# Patient Record
Sex: Female | Born: 1970 | Race: White | Hispanic: No | Marital: Married | Smoking: Never smoker
Health system: Southern US, Community
[De-identification: ages and names within clinical notes are randomized; demographics above are authoritative.]

## PROBLEM LIST (undated history)

## (undated) DIAGNOSIS — G43829 Menstrual migraine, not intractable, without status migrainosus: Secondary | ICD-10-CM

## (undated) DIAGNOSIS — D649 Anemia, unspecified: Secondary | ICD-10-CM

## (undated) DIAGNOSIS — F419 Anxiety disorder, unspecified: Secondary | ICD-10-CM

## (undated) HISTORY — DX: Menstrual migraine, not intractable, without status migrainosus: G43.829

## (undated) HISTORY — DX: Anemia, unspecified: D64.9

---

## 1998-05-20 ENCOUNTER — Other Ambulatory Visit: Admission: RE | Admit: 1998-05-20 | Discharge: 1998-05-20 | Payer: Self-pay | Admitting: *Deleted

## 1998-06-18 ENCOUNTER — Other Ambulatory Visit: Admission: RE | Admit: 1998-06-18 | Discharge: 1998-06-18 | Payer: Self-pay | Admitting: *Deleted

## 1998-12-04 HISTORY — PX: LAPAROSCOPY: SHX197

## 1999-04-22 ENCOUNTER — Other Ambulatory Visit: Admission: RE | Admit: 1999-04-22 | Discharge: 1999-04-22 | Payer: Self-pay | Admitting: Obstetrics and Gynecology

## 1999-11-13 ENCOUNTER — Inpatient Hospital Stay (HOSPITAL_COMMUNITY): Admission: AD | Admit: 1999-11-13 | Discharge: 1999-11-17 | Payer: Self-pay | Admitting: Obstetrics and Gynecology

## 1999-12-26 ENCOUNTER — Other Ambulatory Visit: Admission: RE | Admit: 1999-12-26 | Discharge: 1999-12-26 | Payer: Self-pay | Admitting: Obstetrics and Gynecology

## 2000-12-17 ENCOUNTER — Other Ambulatory Visit: Admission: RE | Admit: 2000-12-17 | Discharge: 2000-12-17 | Payer: Self-pay | Admitting: Obstetrics and Gynecology

## 2001-10-11 ENCOUNTER — Observation Stay (HOSPITAL_COMMUNITY): Admission: AD | Admit: 2001-10-11 | Discharge: 2001-10-12 | Payer: Self-pay | Admitting: *Deleted

## 2001-10-11 ENCOUNTER — Encounter: Payer: Self-pay | Admitting: *Deleted

## 2001-10-24 ENCOUNTER — Inpatient Hospital Stay (HOSPITAL_COMMUNITY): Admission: AD | Admit: 2001-10-24 | Discharge: 2001-10-27 | Payer: Self-pay | Admitting: Obstetrics and Gynecology

## 2001-11-19 ENCOUNTER — Other Ambulatory Visit: Admission: RE | Admit: 2001-11-19 | Discharge: 2001-11-19 | Payer: Self-pay | Admitting: Obstetrics and Gynecology

## 2002-12-18 ENCOUNTER — Other Ambulatory Visit: Admission: RE | Admit: 2002-12-18 | Discharge: 2002-12-18 | Payer: Self-pay | Admitting: Obstetrics and Gynecology

## 2003-12-23 ENCOUNTER — Other Ambulatory Visit: Admission: RE | Admit: 2003-12-23 | Discharge: 2003-12-23 | Payer: Self-pay | Admitting: Obstetrics and Gynecology

## 2005-07-05 ENCOUNTER — Encounter: Admission: RE | Admit: 2005-07-05 | Discharge: 2005-07-05 | Payer: Self-pay | Admitting: Family Medicine

## 2008-01-13 ENCOUNTER — Encounter: Admission: RE | Admit: 2008-01-13 | Discharge: 2008-01-13 | Payer: Self-pay | Admitting: Family Medicine

## 2008-01-17 ENCOUNTER — Encounter: Admission: RE | Admit: 2008-01-17 | Discharge: 2008-01-17 | Payer: Self-pay | Admitting: Family Medicine

## 2011-04-10 ENCOUNTER — Emergency Department (HOSPITAL_COMMUNITY): Payer: BC Managed Care – PPO

## 2011-04-10 ENCOUNTER — Emergency Department (HOSPITAL_COMMUNITY)
Admission: EM | Admit: 2011-04-10 | Discharge: 2011-04-11 | Disposition: A | Discharge status: Home / Self Care | Payer: BC Managed Care – PPO | Attending: Emergency Medicine | Admitting: Emergency Medicine

## 2011-04-10 DIAGNOSIS — R0609 Other forms of dyspnea: Secondary | ICD-10-CM | POA: Insufficient documentation

## 2011-04-10 DIAGNOSIS — R0989 Other specified symptoms and signs involving the circulatory and respiratory systems: Secondary | ICD-10-CM | POA: Insufficient documentation

## 2011-04-10 DIAGNOSIS — R079 Chest pain, unspecified: Secondary | ICD-10-CM | POA: Insufficient documentation

## 2011-04-10 DIAGNOSIS — R0602 Shortness of breath: Secondary | ICD-10-CM | POA: Insufficient documentation

## 2011-04-10 DIAGNOSIS — Z79899 Other long term (current) drug therapy: Secondary | ICD-10-CM | POA: Insufficient documentation

## 2011-04-10 LAB — CK TOTAL AND CKMB (NOT AT ARMC)
CK, MB: 0.9 ng/mL (ref 0.3–4.0)
CK, MB: 0.9 ng/mL (ref 0.3–4.0)
Relative Index: INVALID (ref 0.0–2.5)
Total CK: 47 U/L (ref 7–177)

## 2011-04-10 LAB — TROPONIN I: Troponin I: <0.3 ng/mL (ref <0.30)

## 2011-04-10 LAB — CBC
HCT: 39.4 % (ref 36.0–46.0)
MCHC: 34.5 g/dL (ref 30.0–36.0)
Platelets: 362 10*3/uL (ref 150–400)
RDW: 12.3 % (ref 11.5–15.5)
WBC: 8.9 10*3/uL (ref 4.0–10.5)

## 2011-04-10 LAB — DIFFERENTIAL
Basophils Absolute: 0 10*3/uL (ref 0.0–0.1)
Eosinophils Absolute: 0.1 10*3/uL (ref 0.0–0.7)
Eosinophils Relative: 1 % (ref 0–5)
Monocytes Absolute: 0.5 10*3/uL (ref 0.1–1.0)

## 2011-04-10 LAB — POCT CARDIAC MARKERS
CKMB, poc: <1 ng/mL — ABNORMAL LOW (ref 1.0–8.0)
CKMB, poc: <1 ng/mL — ABNORMAL LOW (ref 1.0–8.0)
Myoglobin, poc: 54.2 ng/mL (ref 12–200)

## 2011-04-10 LAB — BASIC METABOLIC PANEL
BUN: 9 mg/dL (ref 6–23)
CO2: 26 mEq/L (ref 19–32)
Calcium: 9.7 mg/dL (ref 8.4–10.5)
Glucose, Bld: 89 mg/dL (ref 70–99)
Potassium: 4.1 mEq/L (ref 3.5–5.1)
Sodium: 139 mEq/L (ref 135–145)

## 2011-04-10 LAB — D-DIMER, QUANTITATIVE: D-Dimer, Quant: 0.23 ug/mL-FEU (ref 0.00–0.48)

## 2011-04-10 LAB — POCT PREGNANCY, URINE: Preg Test, Ur: NEGATIVE

## 2011-04-11 DIAGNOSIS — R072 Precordial pain: Secondary | ICD-10-CM

## 2011-04-13 ENCOUNTER — Other Ambulatory Visit: Payer: Self-pay | Admitting: Family Medicine

## 2011-04-13 ENCOUNTER — Ambulatory Visit
Admission: RE | Admit: 2011-04-13 | Discharge: 2011-04-13 | Disposition: A | Discharge status: Home / Self Care | Payer: BC Managed Care – PPO | Source: Ambulatory Visit | Attending: Family Medicine | Admitting: Family Medicine

## 2011-04-13 DIAGNOSIS — R0602 Shortness of breath: Secondary | ICD-10-CM

## 2011-04-13 MED ORDER — IOHEXOL 300 MG/ML  SOLN
125.0000 mL | Freq: Once | INTRAMUSCULAR | Status: AC | PRN
  Administered 2011-04-13: 125 mL via INTRAVENOUS

## 2011-04-21 NOTE — H&P (Signed)
[facility] of [facility]  Patient:    Bridget Hicks, Bridget Hicks Visit Number: [id number] MRN: [id number]          Service Type: OBS Location: 910B 9163 01 Attending Physician:  Ermalene Searing Dictated by:   Lenoard Aden, M.D. Admit Date:  10/11/2001                           History and Physical  CHIEF COMPLAINT:              Acute onset of epigastric pain and nausea and vomiting.  HISTORY OF PRESENT ILLNESS:   The patient is a 40 year old white female, G3, P1, EDD November 02, 2001, at 36 6/7 weeks who presents with aforementioned complaints. No history of pregnancy-induced hypertension. She reports nausea, vomiting. Denies fever, chills. Has had an episode of diarrhea today. Reports people at home, too, have been sick.  PAST MEDICAL HISTORY:         Remarkable for no medical or surgical hospitalizations outside of pregnancy, except for a laparoscopy for endometriosis in 2000. 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  ALLERGIES:                    No known drug allergies.  MEDICATIONS:                  Prenatal vitamins.  PRENATAL COURSE:              Uncomplicated, except for new-onset nausea and vomiting today.  PHYSICAL EXAMINATION:  GENERAL:                      She is a well-developed, well-nourished white female. No apparent distress.  HEENT:                        Normal.  LUNGS:                        Clear.  HEART:                        Regular rate and rhythm.  ABDOMEN:                      Soft, gravid, nontender. Estimated fetal weight of 8 1/2 to 9 pounds. Cervix is 3 cm, 50%, vertex, -2.  EXTREMITIES:                  DTRs 2+. No evidence of clonus.  NEUROLOGICAL:                 Nonfocal.  LABORATORY DATA:              CBC reveals a white blood cell count of 15,000 with left shift and  normal PIH labs. Protein without evidence of protein.  IMPRESSION:                   Acute onset epigastric pain with unclear etiology at this time. Rule out appendicitis. Rule out acute viral gastroenteritis. Rule out labor. NFT is reactive. Irregular contractions noted.  PLAN:  Admit. Follow serial labs. Monitor for cervical change at this time. If patient labors, she wishes repeat c-section. Otherwise, we will watch closely for atypical preeclampsia. Further diagnostic studies to be ordered if pain increases. Dictated by:   Lenoard Aden, M.D. Attending Physician:  Marina Gravel B DD:  10/11/01 TD:  10/11/01 Job: [id number]

## 2011-04-21 NOTE — Op Note (Signed)
[facility]  Patient:    Bridget Hicks                  MRN: [id number] Proc. Date: 11/14/99 Adm. Date:  [id number] Attending:  Lenoard Aden CC:         Lenoard Aden, M.D.                           Operative Report  PREOPERATIVE DIAGNOSIS:       41 week intrauterine pregnancy, dysplastic right                               fetal kidney, fetal arrhythmia and failure to                               descend.  POSTOPERATIVE DIAGNOSIS:      41 week intrauterine pregnancy dysplastic right                               fetal kidney, fetal arrhythmia and failure to                               descend.  OPERATION:                    Primary low segment transverse cesarean section.  SURGEON:                      Lenoard Aden, M.D.  ASSISTANT:                    ______  ANESTHESIA:                   Epidural by J. Leilani Able, Montez Hageman., M.D.  ESTIMATED BLOOD LOSS:         7 cc  COMPLICATIONS:                NONE.  DRAINS:                       Foley.  COUNTS:                       Correct.  DISPOSITION:                  Patient recovering, good condition.  FINDINGS:                     Full term living female 9 pounds.  Apgars 8 and 9.                               Placenta manually intact a 3 vessel cord noted.  BRIEF OPERATIVE NOTE:         After being apprised of the risks of anesthesia, infection, bleeding, extra-abdominal arteries and need for repair the patient was brought to the operating room where she was administered a dosing of epidural anesthetic and prepped and draped in the usual sterile fashion.  Foley catheter was placed after achieving adequate anesthesia.  Pfannenstiel skin incision made with a scalpel and  carried down to the fascia which is entered in the midline and opened transversely.  Using Mayo scissors rectus muscles dissected sharply in the midline. Peritoneum entered sharply.  Bladder blade  placed.  Visceral peritoneum scored n a ______ fashion.  Bladder flap developed sharply off the lower uterine segment.  Uterus scored in a similar like fashion ______ hysterotomy incision for delivery of a 9 pound female from [place] transverse position using vacuum assistance.  Handed to pediatrician in attendance receiving Apgars 8 and 9 after bulb suctioning.  Placenta delivered manually intact, 3 vessel cord noted.  Uterus exteriorized.  Normal tubes and ovaries noted.  Uterine incision then closed using a 0 monacryl in continuous running fashion.  A second imbricating layer placed.  Good hemostasis achieved.  Bladder flap inspected.  Rectus muscles were inspected and reapproximated in the midline using monacryl ______ suture.  Fascia closed using 1 Vicryl in a continuous running fashion and skin closed using staples. Dilute Marcaine solution placed.  The patient tolerated the procedure well and as transferred to recovery in good condition. DD:  11/14/99 TD:  11/15/99 Job: [id number]
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
## 2011-04-21 NOTE — Discharge Summary (Signed)
[facility]  Patient:    Bridget Hicks, Bridget Hicks Visit Number: [id number] MRN: [id number]          Service Type: OBS Location: 910A 9105 01 Attending Physician:  Lenoard Aden Dictated by:   Lenoard Aden, M.D. Admit Date:  10/24/2001 Discharge Date: 10/27/2001                             Discharge Summary  HOSPITAL COURSE:              The patient underwent uncomplicated repeat low-segment transverse cesarean section on October 24, 2001. Postoperative course uncomplicated. Remained afebrile. Tolerated a regular diet well on day #1. Hemoglobin of 9.0.  DISPOSITION:                  The patient was discharged to home on day #3. Prenatal vitamins, iron, Tylox, and ibuprofen given. Followup in the office in 4 to 6 weeks. Dictated by:   Lenoard Aden, M.D. Attending Physician:  Lenoard Aden DD:  11/13/01 TD:  11/14/01 Job: [id number]

## 2011-04-21 NOTE — H&P (Signed)
[facility] of [facility]  Patient:    Bridget Hicks, Bridget Hicks Visit Number: [id number] MRN: [id number]          Service Type: OBS Location: 910A 9105 01 Attending Physician:  Lenoard Aden Dictated by:   Lenoard Aden, M.D. Admit Date:  10/24/2001                           History and Physical  CHIEF COMPLAINT:  Elective repeat C-section.  HISTORY OF PRESENT ILLNESS:  A 40 year old white female G3, P1, ED November 02, 2001, at 39 weeks for elective repeat C-section.  History of C-section for active phase arrest was December 2000, of a 9 pound female.  D&C for missed abdomen December 2001.  ALLERGIES:  There are no known drug allergies.  MEDICATIONS:  Prenatal vitamins.  FAMILY HISTORY:  Daughter with polycystic kidney with normal follow up. Previous history of laparoscopy for endometriosis.  LABORATORY DATA:  Prenatal lab data:  Blood type B negative.  Rh antibody negative.  Rubella immune.  Hepatitis B surface antigen negative.  HIV nonreactive.  GC and chlamydia negative.  Group B streptococcus negative.  Prenatal course complicated by previous placenta previa.  Resolution on ultrasound.  Preterm cervical change responsive to short term tocolytics.  PHYSICAL EXAMINATION:  Well-developed, well-nourished white female in no acute distress.  HEENT:  Normal.  LUNGS: Clear.  HEART:  Regular rate and rhythm.  ABDOMEN:  Soft, gravid, nontender.  ESTIMATED FETAL WEIGHT:  9.5 pounds.  Cervix is 2-3 cm posterior, thick, vertex minus 1.  EXTREMITIES:  Show no cords.  NEUROLOGIC:  Nonfocal.  IMPRESSION:  39 week intrauterine pregnancy. 2. Previous C-section for repeat.  PLAN:  Proceed with elective repeat C-section.  Risks of anesthesia, infection, bleeding, injury of abdominal organs, and need for repair discussed.  Patient acknowledges and desires to proceed. Dictated by:   Lenoard Aden, M.D. Attending Physician:  Lenoard Aden DD:  10/24/01 TD:  10/24/01 Job: [id number]

## 2011-04-21 NOTE — Op Note (Signed)
[facility] of [facility]  Patient:    Bridget Hicks, Bridget Hicks Visit Number: [id number] MRN: [id number]          Service Type: OBS Location: 910A 9105 01 Attending Physician:  Lenoard Aden Dictated by:   Lenoard Aden, M.D. Proc. Date: 10/24/01 Admit Date:  10/24/2001                             Operative Report  PREOPERATIVE DIAGNOSES:       1. A 39-week intrauterine pregnancy.                               2. Previous cesarean section for repeat.  POSTOPERATIVE DIAGNOSES:      1. A 39-week intrauterine pregnancy.                               2. Previous cesarean section for repeat.  PROCEDURE:                    Repeat low segment transverse cesarean section.  SURGEON:                      Lenoard Aden, M.D.  ASSISTANT:                    Marina Gravel, M.D.  ANESTHESIA:                   Spinal by Jean Rosenthal.  ESTIMATED BLOOD LOSS:         800 cc.  COMPLICATIONS:                None.  DRAINS:                       Foley.  COUNTS:                       Correct.  DISPOSITION:                  Patient to recovery room in good condition.  FINDINGS:                     A 9-pound 2-ounce female, left occiput transverse position delivered via vacuum assistance x 3 pulls. Apgars were 8 and 9. There were no extensions. Placenta manually intact, anterior, three-vessel cord noted. Uterus, normal tubes and ovaries noted.  DESCRIPTION OF PROCEDURE:     After being apprised of the risks of anesthesia, infection, bleeding, injury to abdominal organs with need for repair, the patient was brought to the operating room where she is administered spinal anesthetic without complications. She was prepped and draped in the usual sterile fashion and a Foley catheter was placed. After achieving adequate anesthesia, Marcaine solution was placed in the area of the previous Pfannenstiel skin incision. Incision was made with a scalpel and carried down to the  fascia which was nicked in the midline and opened transversely using Mayo scissors. The rectus muscle was dissected sharply in the midline. The peritoneum was entered sharply. The bladder blade was placed. The visceral peritoneum was scored in a smile-like fashion. The uterus was scored in smile-like fashion via a Kerr hysterotomy incision, clear fluid on amniotomy is noted, and atraumatic vacuum  assisted delivery of a 9-pound 2-ounce female with extension of a right Maylard incision. Apgars 8 and 9 noted and the infant was handed to pediatricians in attendance after bulb suction. The placenta was delivered manually, intact. The uterus was exteriorized and curetted using a dry lap pack and closed using a O Monocryl in continuous running fashion. A second imbricating layer was placed. The bladder flap was inspected and found to be hemostatic. Paracolic gutters were irrigated. All blood clots were subsequently removed. The fascia was closed using O Vicryl in continuous running fashion. The skin was closed using staples. The patient was transferred to recovery in good condition. Dictated by:   Lenoard Aden, M.D. Attending Physician:  Lenoard Aden DD:  10/24/01 TD:  10/24/01 Job: [id number]

## 2011-04-25 ENCOUNTER — Other Ambulatory Visit: Payer: Self-pay | Admitting: Obstetrics and Gynecology

## 2011-04-25 DIAGNOSIS — Z1231 Encounter for screening mammogram for malignant neoplasm of breast: Secondary | ICD-10-CM

## 2011-05-29 ENCOUNTER — Ambulatory Visit
Admission: RE | Admit: 2011-05-29 | Discharge: 2011-05-29 | Disposition: A | Discharge status: Home / Self Care | Payer: BC Managed Care – PPO | Source: Ambulatory Visit | Attending: Obstetrics and Gynecology | Admitting: Obstetrics and Gynecology

## 2011-05-29 DIAGNOSIS — Z1231 Encounter for screening mammogram for malignant neoplasm of breast: Secondary | ICD-10-CM

## 2012-05-14 ENCOUNTER — Other Ambulatory Visit: Payer: Self-pay | Admitting: Obstetrics and Gynecology

## 2012-05-14 DIAGNOSIS — Z1231 Encounter for screening mammogram for malignant neoplasm of breast: Secondary | ICD-10-CM

## 2012-05-31 ENCOUNTER — Ambulatory Visit: Payer: BC Managed Care – PPO

## 2012-06-07 ENCOUNTER — Other Ambulatory Visit: Payer: Self-pay | Admitting: Obstetrics and Gynecology

## 2012-06-07 ENCOUNTER — Ambulatory Visit
Admission: RE | Admit: 2012-06-07 | Discharge: 2012-06-07 | Disposition: A | Discharge status: Home / Self Care | Payer: BC Managed Care – PPO | Source: Ambulatory Visit | Attending: Obstetrics and Gynecology | Admitting: Obstetrics and Gynecology

## 2012-06-07 DIAGNOSIS — Z1231 Encounter for screening mammogram for malignant neoplasm of breast: Secondary | ICD-10-CM

## 2013-05-07 ENCOUNTER — Other Ambulatory Visit: Payer: Self-pay

## 2013-05-07 DIAGNOSIS — Z1231 Encounter for screening mammogram for malignant neoplasm of breast: Secondary | ICD-10-CM

## 2013-06-09 ENCOUNTER — Ambulatory Visit
Admission: RE | Admit: 2013-06-09 | Discharge: 2013-06-09 | Disposition: A | Discharge status: Home / Self Care | Payer: BC Managed Care – PPO | Source: Ambulatory Visit

## 2013-06-09 DIAGNOSIS — Z1231 Encounter for screening mammogram for malignant neoplasm of breast: Secondary | ICD-10-CM

## 2014-05-08 ENCOUNTER — Other Ambulatory Visit: Payer: Self-pay

## 2014-05-08 DIAGNOSIS — Z1231 Encounter for screening mammogram for malignant neoplasm of breast: Secondary | ICD-10-CM

## 2014-06-10 ENCOUNTER — Ambulatory Visit
Admission: RE | Admit: 2014-06-10 | Discharge: 2014-06-10 | Disposition: A | Discharge status: Home / Self Care | Payer: BC Managed Care – PPO | Source: Ambulatory Visit

## 2014-06-10 DIAGNOSIS — Z1231 Encounter for screening mammogram for malignant neoplasm of breast: Secondary | ICD-10-CM

## 2015-05-06 ENCOUNTER — Other Ambulatory Visit: Payer: Self-pay

## 2015-05-06 DIAGNOSIS — Z1231 Encounter for screening mammogram for malignant neoplasm of breast: Secondary | ICD-10-CM

## 2015-06-14 ENCOUNTER — Ambulatory Visit
Admission: RE | Admit: 2015-06-14 | Discharge: 2015-06-14 | Disposition: A | Discharge status: Home / Self Care | Payer: BC Managed Care – PPO | Source: Ambulatory Visit

## 2015-06-14 DIAGNOSIS — Z1231 Encounter for screening mammogram for malignant neoplasm of breast: Secondary | ICD-10-CM

## 2016-04-10 ENCOUNTER — Other Ambulatory Visit: Payer: Self-pay

## 2016-04-10 DIAGNOSIS — Z1231 Encounter for screening mammogram for malignant neoplasm of breast: Secondary | ICD-10-CM

## 2016-06-14 ENCOUNTER — Ambulatory Visit
Admission: RE | Admit: 2016-06-14 | Discharge: 2016-06-14 | Disposition: A | Discharge status: Home / Self Care | Payer: BC Managed Care – PPO | Source: Ambulatory Visit

## 2016-06-14 DIAGNOSIS — Z1231 Encounter for screening mammogram for malignant neoplasm of breast: Secondary | ICD-10-CM

## 2017-03-15 ENCOUNTER — Other Ambulatory Visit: Payer: Self-pay | Admitting: Obstetrics and Gynecology

## 2017-03-15 DIAGNOSIS — Z1231 Encounter for screening mammogram for malignant neoplasm of breast: Secondary | ICD-10-CM

## 2017-06-15 ENCOUNTER — Ambulatory Visit
Admission: RE | Admit: 2017-06-15 | Discharge: 2017-06-15 | Disposition: A | Discharge status: Home / Self Care | Payer: BC Managed Care – PPO | Source: Ambulatory Visit | Attending: Obstetrics and Gynecology | Admitting: Obstetrics and Gynecology

## 2017-06-15 DIAGNOSIS — Z1231 Encounter for screening mammogram for malignant neoplasm of breast: Secondary | ICD-10-CM

## 2017-11-11 ENCOUNTER — Telehealth: Payer: Self-pay | Admitting: *Deleted

## 2017-11-11 NOTE — Telephone Encounter (Signed)
Called and spoke with patient informing her that the office will be closed tomorrow, Monday 12/10 and we will call ASAP to reschedule appointment. She verbalized understanding and appreciation for the call.

## 2017-11-11 NOTE — Telephone Encounter (Signed)
Called and LVM regarding appt, asking patient to call during business hours. Will also try to call back later today. No DPR on file.

## 2017-11-12 ENCOUNTER — Ambulatory Visit: Payer: BC Managed Care – PPO | Admitting: Neurology

## 2017-11-14 NOTE — Telephone Encounter (Signed)
Called and spoke with patient. I have rescheduled her for this Friday 11/16/17 @ 2:00 with an arrival time of 1:30. She verbalized understanding and appreciation.

## 2017-11-15 NOTE — Progress Notes (Signed)
[facility]    Provider:  Dr Lucia GaskinsAhern Referring Provider: Olivia Mackieaavon, Richard, MD Primary Care Physician:  Olivia Mackieaavon, Richard, MD  CC: menstrual  migraines  HPI:  Bridget Hicks is a 46 y.o. female here as a referral from Dr. Billy Coastaavon for migraines. Naratriptan helps, she may have to take 2 but that is rare. Migraines are right-sided, throbbing, photophobia, mild nausea but more weakness and feeling sick, needs a dark room, moving makes it worse. She tried a lot of other triptans. Migraines can be severe. Migraines happen during her period. Discussed management of menstrual migraines and birth control. She usually has one severe headache migraine day a month, 2 mild at most 6 migraine days a month. No other focal neurologic deficits, associated symptoms, inciting events or modifiable factors. She is happy with current management.   Reviewed notes, labs and imaging from outside physicians, which showed:  Reviewed referring physician's notes.  Stable menstrual pattern, she is using naratriptan for acute treatment.  She is on birth control pills.  She is a prior patient of Dr. Alda PonderBlewett.  Headaches have been stable in the past.  At last appointment she reported one severe headache in 1-2 moderate headache days in the previous 30 days.  Headaches occur mainly during menstruation.  Tend to be located over the right side, when intense it throbs with associated nausea as well as light and sound sensitivity.  Amerge was working well for her and generally one dose was effective.  In the past she tried Relpax without benefit.  Maxalt initially of some benefit but eventually lost its effectiveness.  She is a Manufacturing systems engineerpreschool teacher.  Exam was normal.  She was continued on only acute medication. Used Lodine as well.   Review of Systems: Patient complains of symptoms per HPI as well as the following symptoms: migraines. Pertinent negatives and positives per HPI. All others negative.   Social History    Socioeconomic History  . Marital status: Married    Spouse name: Not on file  . Number of children: 2  . Years of education: Not on file  . Highest education level: Bachelor's degree (e.g., BA, AB, BS)  Social Needs  . Financial resource strain: Not on file  . Food insecurity - worry: Not on file  . Food insecurity - inability: Not on file  . Transportation needs - medical: Not on file  . Transportation needs - non-medical: Not on file  Occupational History  . Occupation: Runner, broadcasting/film/videoteacher    Comment: [employer]  Tobacco Use  . Smoking status: Never Smoker  . Smokeless tobacco: Never Used  Substance and Sexual Activity  . Alcohol use: No    Frequency: Never  . Drug use: No  . Sexual activity: Not on file  Other Topics Concern  . Not on file  Social History Narrative   Lives at home with her family   Right handed   1 cup caffeine a day if that     Family History  Problem Relation Age of Onset  . Hypertension Father   . Heart attack Maternal Grandfather   . Heart attack Paternal Grandfather     Past Medical History:  Diagnosis Date  . Anemia   . Migraine, menstrual     Past Surgical History:  Procedure Laterality Date  . CESAREAN SECTION     x 2   . LAPAROSCOPY  2000    Current Outpatient Medications  Medication Sig Dispense Refill  . desogestrel-ethinyl estradiol (MIRCETTE) 0.15-0.02/0.01 MG (  21/5) tablet Take 1 tablet by mouth daily.    . Multiple Vitamin (MULTIVITAMIN) tablet Take 1 tablet by mouth daily.    . naratriptan (AMERGE) 2.5 MG tablet Take 1 tablet (2.5 mg total) by mouth as needed for migraine. 10 tablet 12   No current facility-administered medications for this visit.     Allergies as of 11/16/2017  . (Not on File)    Vitals: BP 124/75 (BP Location: Right Arm, Patient Position: Sitting)   Pulse 68   Ht 5\' 8"  (1.727 m)   Wt 196 lb (88.9 kg)   LMP 11/02/2017 (Exact Date)   BMI 29.80 kg/m  Last Weight:  Wt Readings from Last 1  Encounters:  11/16/17 196 lb (88.9 kg)   Last Height:   Ht Readings from Last 1 Encounters:  11/16/17 5\' 8"  (1.727 m)    Physical exam: Exam: Gen: NAD, conversant, well nourised, well groomed                     CV: RRR, no MRG. No Carotid Bruits. No peripheral edema, warm, nontender Eyes: Conjunctivae clear without exudates or hemorrhage  Neuro: Detailed Neurologic Exam  Speech:    Speech is normal; fluent and spontaneous with normal comprehension.  Cognition:    The patient is oriented to person, place, and time;     recent and remote memory intact;     language fluent;     normal attention, concentration,     fund of knowledge Cranial Nerves:    The pupils are equal, round, and reactive to light. The fundi are normal and spontaneous venous pulsations are present. Visual fields are full to finger confrontation. Extraocular movements are intact. Trigeminal sensation is intact and the muscles of mastication are normal. The face is symmetric. The palate elevates in the midline. Hearing intact. Voice is normal. Shoulder shrug is normal. The tongue has normal motion without fasciculations.   Coordination:    Normal finger to nose and heel to shin. Normal rapid alternating movements.   Gait:    Heel-toe and tandem gait are normal.   Motor Observation:    No asymmetry, no atrophy, and no involuntary movements noted. Tone:    Normal muscle tone.    Posture:    Posture is normal. normal erect    Strength:    Strength is V/V in the upper and lower limbs.      Sensation: intact to LT     Reflex Exam:  DTR's:    Deep tendon reflexes in the upper and lower extremities are normal bilaterally.   Toes:    The toes are downgoing bilaterally.   Clonus:    Clonus is absent.      Assessment/Plan:  46 year old with migraines, well controlled with just acute management  Magnesium citrate 400mg  to 600mg  daily, riboflavin 400mg  daily, Coenzyme Q 10 100mg  three times daily may  have some benefits in migraine Consider joining Facebook group [facility]. Continue Naratriptan prn  Discussed: To prevent or relieve headaches, try the following: Cool Compress. Lie down and place a cool compress on your head.  Avoid headache triggers. If certain foods or odors seem to have triggered your migraines in the past, avoid them. A headache diary might help you identify triggers.  Include physical activity in your daily routine. Try a daily walk or other moderate aerobic exercise.  Manage stress. Find healthy ways to cope with the stressors, such as delegating tasks on  your to-do list.  Practice relaxation techniques. Try deep breathing, yoga, massage and visualization.  Eat regularly. Eating regularly scheduled meals and maintaining a healthy diet might help prevent headaches. Also, drink plenty of fluids.  Follow a regular sleep schedule. Sleep deprivation might contribute to headaches Consider biofeedback. With this mind-body technique, you learn to control certain bodily functions - such as muscle tension, heart rate and blood pressure - to prevent headaches or reduce headache pain.    Proceed to emergency room if you experience new or worsening symptoms or symptoms do not resolve, if you have new neurologic symptoms or if headache is severe, or for any concerning symptom.   Provided education and documentation from American headache Society toolbox including articles on: chronic migraine medication overuse headache, chronic migraines, prevention of migraines, behavioral and other nonpharmacologic treatments for headache.  Orders Placed This Encounter  Procedures  . Basic Metabolic Panel   Cc:  Olivia Mackieaavon, Richard, MD   Naomie DeanAntonia Ahern, MD  [facility] [address] [place], [place] [address]  Phone [phone] Fax [phone]

## 2017-11-16 ENCOUNTER — Ambulatory Visit: Payer: BC Managed Care – PPO | Admitting: Neurology

## 2017-11-16 ENCOUNTER — Encounter: Payer: Self-pay | Admitting: Neurology

## 2017-11-16 VITALS — BP 124/75 | HR 68 | Ht 68.0 in | Wt 196.0 lb

## 2017-11-16 DIAGNOSIS — G43009 Migraine without aura, not intractable, without status migrainosus: Secondary | ICD-10-CM

## 2017-11-16 MED ORDER — NARATRIPTAN HCL 2.5 MG PO TABS: 2.5000 mg | ORAL_TABLET | ORAL | 12 refills | Status: DC | PRN

## 2017-11-16 NOTE — Patient Instructions (Addendum)
Magnesium citrate 400mg  to 600mg  daily, riboflavin 400mg  daily, Coenzyme Q 10 100mg  three times daily may have some benefits in migraine Consider joining Facebook group [facility].

## 2017-11-19 DIAGNOSIS — G43009 Migraine without aura, not intractable, without status migrainosus: Secondary | ICD-10-CM | POA: Insufficient documentation

## 2018-05-07 ENCOUNTER — Other Ambulatory Visit: Payer: Self-pay | Admitting: Obstetrics and Gynecology

## 2018-05-07 DIAGNOSIS — Z1231 Encounter for screening mammogram for malignant neoplasm of breast: Secondary | ICD-10-CM

## 2018-06-26 ENCOUNTER — Ambulatory Visit
Admission: RE | Admit: 2018-06-26 | Discharge: 2018-06-26 | Disposition: A | Discharge status: Home / Self Care | Payer: BC Managed Care – PPO | Source: Ambulatory Visit | Attending: Obstetrics and Gynecology | Admitting: Obstetrics and Gynecology

## 2018-06-26 DIAGNOSIS — Z1231 Encounter for screening mammogram for malignant neoplasm of breast: Secondary | ICD-10-CM

## 2018-11-18 ENCOUNTER — Ambulatory Visit: Payer: BC Managed Care – PPO | Admitting: Neurology

## 2018-11-18 ENCOUNTER — Encounter: Payer: Self-pay | Admitting: Neurology

## 2018-11-18 VITALS — BP 137/78 | HR 75 | Ht 68.0 in | Wt 189.6 lb

## 2018-11-18 DIAGNOSIS — G43009 Migraine without aura, not intractable, without status migrainosus: Secondary | ICD-10-CM | POA: Diagnosis not present

## 2018-11-18 MED ORDER — NARATRIPTAN HCL 2.5 MG PO TABS: 2.5000 mg | ORAL_TABLET | ORAL | 12 refills | Status: DC | PRN

## 2018-11-18 NOTE — Progress Notes (Signed)
[facility]    Provider:  Dr Lucia Gaskins Referring Provider: Olivia Mackie, MD Primary Care Physician:  Blair Heys, MD  CC: menstrual  Migraines  Interval history: Still the same. Usually around her periods. Friday and Saturday of the cycle. Naratriptan Discussed pre-treatment. She has side effects to her naratriptan. She can also pre-treat with alleve but affects stomach. She tried not having a period but she didn't like not having menstruation for 3 months. She just has 2 days of migraines a month. Naratriptan has been the best. She tried a lot of triptans.   HPI:  Bridget Hicks is a 47 y.o. female here as a referral from Dr. Billy Coast for migraines. Naratriptan helps, she may have to take 2 but that is rare. Migraines are right-sided, throbbing, photophobia, mild nausea but more weakness and feeling sick, needs a dark room, moving makes it worse. She tried a lot of other triptans. Migraines can be severe. Migraines happen during her period. Discussed management of menstrual migraines and birth control. She usually has one severe headache migraine day a month, 2 mild at most 6 migraine days a month. No other focal neurologic deficits, associated symptoms, inciting events or modifiable factors. She is happy with current management.   Reviewed notes, labs and imaging from outside physicians, which showed:  Reviewed referring physician's notes.  Stable menstrual pattern, she is using naratriptan for acute treatment.  She is on birth control pills.  She is a prior patient of Dr. Alda Ponder.  Headaches have been stable in the past.  At last appointment she reported one severe headache in 1-2 moderate headache days in the previous 30 days.  Headaches occur mainly during menstruation.  Tend to be located over the right side, when intense it throbs with associated nausea as well as light and sound sensitivity.  Amerge was working well for her and generally one dose was effective.  In  the past she tried Relpax without benefit.  Maxalt initially of some benefit but eventually lost its effectiveness.  She is a Manufacturing systems engineer.  Exam was normal.  She was continued on only acute medication. Used Lodine as well.   Review of Systems: Patient complains of symptoms per HPI as well as the following symptoms: migraines. Pertinent negatives and positives per HPI. All others negative.   Social History   Socioeconomic History  . Marital status: Married    Spouse name: Not on file  . Number of children: 2  . Years of education: Not on file  . Highest education level: Bachelor's degree (e.g., BA, AB, BS)  Occupational History  . Occupation: Runner, broadcasting/film/video    Comment: [employer]  Social Needs  . Financial resource strain: Not on file  . Food insecurity:    Worry: Not on file    Inability: Not on file  . Transportation needs:    Medical: Not on file    Non-medical: Not on file  Tobacco Use  . Smoking status: Never Smoker  . Smokeless tobacco: Never Used  Substance and Sexual Activity  . Alcohol use: No    Frequency: Never  . Drug use: No  . Sexual activity: Not on file  Lifestyle  . Physical activity:    Days per week: Not on file    Minutes per session: Not on file  . Stress: Not on file  Relationships  . Social connections:    Talks on phone: Not on file    Gets together: Not on file  Attends religious service: Not on file    Active member of club or organization: Not on file    Attends meetings of clubs or organizations: Not on file    Relationship status: Not on file  . Intimate partner violence:    Fear of current or ex partner: Not on file    Emotionally abused: Not on file    Physically abused: Not on file    Forced sexual activity: Not on file  Other Topics Concern  . Not on file  Social History Narrative   Lives at home with her family   Right handed   1 cup caffeine a day if that     Family History  Problem Relation Age of Onset  .  Hypertension Father   . Heart attack Maternal Grandfather   . Heart attack Paternal Grandfather     Past Medical History:  Diagnosis Date  . Anemia   . Migraine, menstrual     Past Surgical History:  Procedure Laterality Date  . CESAREAN SECTION     x 2   . LAPAROSCOPY  2000    Current Outpatient Medications  Medication Sig Dispense Refill  . desogestrel-ethinyl estradiol (MIRCETTE) 0.15-0.02/0.01 MG (21/5) tablet Take 1 tablet by mouth daily.    . Multiple Vitamin (MULTIVITAMIN) tablet Take 1 tablet by mouth daily.    . naratriptan (AMERGE) 2.5 MG tablet Take 1 tablet (2.5 mg total) by mouth as needed for migraine. 10 tablet 12   No current facility-administered medications for this visit.     Allergies as of 11/18/2018  . (No Known Allergies)    Vitals: BP 137/78   Pulse 75   Ht 5\' 8"  (1.727 m)   Wt 189 lb 9.6 oz (86 kg)   BMI 28.83 kg/m  Last Weight:  Wt Readings from Last 1 Encounters:  11/18/18 189 lb 9.6 oz (86 kg)   Last Height:   Ht Readings from Last 1 Encounters:  11/18/18 5\' 8"  (1.727 m)    Physical exam: Exam: Gen: NAD, conversant, well nourised, well groomed                     CV: RRR, no MRG. No Carotid Bruits. No peripheral edema, warm, nontender Eyes: Conjunctivae clear without exudates or hemorrhage  Neuro: Detailed Neurologic Exam  Speech:    Speech is normal; fluent and spontaneous with normal comprehension.  Cognition:    The patient is oriented to person, place, and time;     recent and remote memory intact;     language fluent;     normal attention, concentration,     fund of knowledge Cranial Nerves:    The pupils are equal, round, and reactive to light. The fundi are normal and spontaneous venous pulsations are present. Visual fields are full to finger confrontation. Extraocular movements are intact. Trigeminal sensation is intact and the muscles of mastication are normal. The face is symmetric. The palate elevates in the  midline. Hearing intact. Voice is normal. Shoulder shrug is normal. The tongue has normal motion without fasciculations.   Coordination:    Normal finger to nose and heel to shin. Normal rapid alternating movements.   Gait:    Heel-toe and tandem gait are normal.   Motor Observation:    No asymmetry, no atrophy, and no involuntary movements noted. Tone:    Normal muscle tone.    Posture:    Posture is normal. normal erect    Strength:  Strength is V/V in the upper and lower limbs.      Sensation: intact to LT     Reflex Exam:  DTR's:    Deep tendon reflexes in the upper and lower extremities are normal bilaterally.   Toes:    The toes are downgoing bilaterally.   Clonus:    Clonus is absent.      Assessment/Plan:  47 year old with migraines, well controlled with just acute management  Discussed strategies for menses-associated migraines such as prevention pre-treatment, acute treatment and different medications we can use. Try the almotriptan the evening prior, has 6 hour 1/2 life, discussed different pharmacologic properties of different triptans with Frova being the longest  Discussed: To prevent or relieve headaches, try the following: Cool Compress. Lie down and place a cool compress on your head.  Avoid headache triggers. If certain foods or odors seem to have triggered your migraines in the past, avoid them. A headache diary might help you identify triggers.  Include physical activity in your daily routine. Try a daily walk or other moderate aerobic exercise.  Manage stress. Find healthy ways to cope with the stressors, such as delegating tasks on your to-do list.  Practice relaxation techniques. Try deep breathing, yoga, massage and visualization.  Eat regularly. Eating regularly scheduled meals and maintaining a healthy diet might help prevent headaches. Also, drink plenty of fluids.  Follow a regular sleep schedule. Sleep deprivation might contribute to  headaches Consider biofeedback. With this mind-body technique, you learn to control certain bodily functions - such as muscle tension, heart rate and blood pressure - to prevent headaches or reduce headache pain.    Proceed to emergency room if you experience new or worsening symptoms or symptoms do not resolve, if you have new neurologic symptoms or if headache is severe, or for any concerning symptom.   Provided education and documentation from American headache Society toolbox including articles on: chronic migraine medication overuse headache, chronic migraines, prevention of migraines, behavioral and other nonpharmacologic treatments for headache.   Cc:  Olivia Mackie, MD   Naomie Dean, MD  [facility] [address] [place], [place] [address]  Phone [phone] Fax [phone] A total of 25  minutes was spent face-to-face with this patient. Over half this time was spent on counseling patient on the  1. Migraine without aura and without status migrainosus, not intractable     diagnosis and different diagnostic and therapeutic options, counseling and coordination of care, risks ans benefits of management, compliance, or risk factor reduction and education.

## 2019-05-19 ENCOUNTER — Other Ambulatory Visit: Payer: Self-pay | Admitting: Obstetrics and Gynecology

## 2019-05-19 DIAGNOSIS — Z1231 Encounter for screening mammogram for malignant neoplasm of breast: Secondary | ICD-10-CM

## 2019-07-15 ENCOUNTER — Ambulatory Visit
Admission: RE | Admit: 2019-07-15 | Discharge: 2019-07-15 | Disposition: A | Discharge status: Home / Self Care | Payer: BC Managed Care – PPO | Source: Ambulatory Visit | Attending: Obstetrics and Gynecology | Admitting: Obstetrics and Gynecology

## 2019-07-15 ENCOUNTER — Other Ambulatory Visit: Payer: Self-pay

## 2019-07-15 DIAGNOSIS — Z1231 Encounter for screening mammogram for malignant neoplasm of breast: Secondary | ICD-10-CM

## 2019-07-16 ENCOUNTER — Other Ambulatory Visit: Payer: Self-pay | Admitting: Obstetrics and Gynecology

## 2019-07-16 DIAGNOSIS — R928 Other abnormal and inconclusive findings on diagnostic imaging of breast: Secondary | ICD-10-CM

## 2019-07-21 ENCOUNTER — Ambulatory Visit
Admission: RE | Admit: 2019-07-21 | Discharge: 2019-07-21 | Disposition: A | Discharge status: Home / Self Care | Payer: BC Managed Care – PPO | Source: Ambulatory Visit | Attending: Obstetrics and Gynecology | Admitting: Obstetrics and Gynecology

## 2019-07-21 ENCOUNTER — Other Ambulatory Visit: Payer: Self-pay

## 2019-07-21 DIAGNOSIS — R928 Other abnormal and inconclusive findings on diagnostic imaging of breast: Secondary | ICD-10-CM

## 2019-11-18 ENCOUNTER — Other Ambulatory Visit: Payer: Self-pay | Admitting: Neurology

## 2020-01-31 ENCOUNTER — Ambulatory Visit: Payer: BC Managed Care – PPO | Attending: Internal Medicine

## 2020-01-31 DIAGNOSIS — Z23 Encounter for immunization: Secondary | ICD-10-CM | POA: Insufficient documentation

## 2020-01-31 NOTE — Progress Notes (Signed)
   Covid-19 Vaccination Clinic  Name:  Bridget Hicks    MRN: [id number] DOB: 1971-09-25  01/31/2020  Bridget Hicks was observed post Covid-19 immunization for 15 minutes without incidence. She was provided with Vaccine Information Sheet and instruction to access the V-Safe system.   Bridget Hicks was instructed to call 911 with any severe reactions post vaccine: Marland Kitchen Difficulty breathing  . Swelling of your face and throat  . A fast heartbeat  . A bad rash all over your body  . Dizziness and weakness    Immunizations Administered    Name Date Dose VIS Date Route   Pfizer COVID-19 Vaccine 01/31/2020 11:02 AM 0.3 mL 11/14/2019 Intramuscular   Manufacturer: [employer], [employer]   Lot: [id number]   NDC: [address]-4

## 2020-02-21 ENCOUNTER — Ambulatory Visit: Payer: BC Managed Care – PPO | Attending: Internal Medicine

## 2020-02-21 DIAGNOSIS — Z23 Encounter for immunization: Secondary | ICD-10-CM

## 2020-02-21 NOTE — Progress Notes (Signed)
   Covid-19 Vaccination Clinic  Name:  Bridget Hicks    MRN: [id number] DOB: 02-16-1971  02/21/2020  [name] was observed post Covid-19 immunization for 15 minutes without incident. She was provided with Vaccine Information Sheet and instruction to access the V-Safe system.   [name] was instructed to call 911 with any severe reactions post vaccine: Marland Kitchen Difficulty breathing  . Swelling of face and throat  . A fast heartbeat  . A bad rash all over body  . Dizziness and weakness   Immunizations Administered    Name Date Dose VIS Date Route   Pfizer COVID-19 Vaccine 02/21/2020 12:15 PM 0.3 mL 11/14/2019 Intramuscular   Manufacturer: [employer], [employer]   Lot: [id number]   NDC: [address]-6

## 2020-02-25 ENCOUNTER — Ambulatory Visit: Payer: BC Managed Care – PPO

## 2020-03-01 ENCOUNTER — Encounter: Payer: Self-pay | Admitting: Family Medicine

## 2020-03-01 ENCOUNTER — Ambulatory Visit: Payer: BC Managed Care – PPO | Admitting: Family Medicine

## 2020-03-01 ENCOUNTER — Other Ambulatory Visit: Payer: Self-pay

## 2020-03-01 VITALS — BP 134/84 | HR 73 | Temp 98.1°F | Ht 68.0 in | Wt 180.0 lb

## 2020-03-01 DIAGNOSIS — G43009 Migraine without aura, not intractable, without status migrainosus: Secondary | ICD-10-CM | POA: Diagnosis not present

## 2020-03-01 MED ORDER — NARATRIPTAN HCL 2.5 MG PO TABS: ORAL_TABLET | ORAL | 11 refills | Status: AC

## 2020-03-01 NOTE — Patient Instructions (Signed)
Continue naratriptan as needed for abortive therapy   Stay well hydrated, well balance diet and regular exercise advised   Follow up with PCP for refill, or with [place] in 1 year  Migraine Headache A migraine headache is a very strong throbbing pain on one side or both sides of your head. This type of headache can also cause other symptoms. It can last from 4 hours to 3 days. Talk with your doctor about what things may bring on (trigger) this condition. What are the causes? The exact cause of this condition is not known. This condition may be triggered or caused by:  Drinking alcohol.  Smoking.  Taking medicines, such as: ? Medicine used to treat chest pain (nitroglycerin). ? Birth control pills. ? Estrogen. ? Some blood pressure medicines.  Eating or drinking certain products.  Doing physical activity. Other things that may trigger a migraine headache include:  Having a menstrual period.  Pregnancy.  Hunger.  Stress.  Not getting enough sleep or getting too much sleep.  Weather changes.  Tiredness (fatigue). What increases the risk?  Being 63-27 years old.  Being female.  Having a family history of migraine headaches.  Being Caucasian.  Having depression or anxiety.  Being very overweight. What are the signs or symptoms?  A throbbing pain. This pain may: ? Happen in any area of the head, such as on one side or both sides. ? Make it hard to do daily activities. ? Get worse with physical activity. ? Get worse around bright lights or loud noises.  Other symptoms may include: ? Feeling sick to your stomach (nauseous). ? Vomiting. ? Dizziness. ? Being sensitive to bright lights, loud noises, or smells.  Before you get a migraine headache, you may get warning signs (an aura). An aura may include: ? Seeing flashing lights or having blind spots. ? Seeing bright spots, halos, or zigzag lines. ? Having tunnel vision or blurred vision. ? Having numbness or a  tingling feeling. ? Having trouble talking. ? Having weak muscles.  Some people have symptoms after a migraine headache (postdromal phase), such as: ? Tiredness. ? Trouble thinking (concentrating). How is this treated?  Taking medicines that: ? Relieve pain. ? Relieve the feeling of being sick to your stomach. ? Prevent migraine headaches.  Treatment may also include: ? Having acupuncture. ? Avoiding foods that bring on migraine headaches. ? Learning ways to control your body functions (biofeedback). ? Therapy to help you know and deal with negative thoughts (cognitive behavioral therapy). Follow these instructions at home: Medicines  Take over-the-counter and prescription medicines only as told by your doctor.  Ask your doctor if the medicine prescribed to you: ? Requires you to avoid driving or using heavy machinery. ? Can cause trouble pooping (constipation). You may need to take these steps to prevent or treat trouble pooping:  Drink enough fluid to keep your pee (urine) pale yellow.  Take over-the-counter or prescription medicines.  Eat foods that are high in fiber. These include beans, whole grains, and fresh fruits and vegetables.  Limit foods that are high in fat and sugar. These include fried or sweet foods. Lifestyle  Do not drink alcohol.  Do not use any products that contain nicotine or tobacco, such as cigarettes, e-cigarettes, and chewing tobacco. If you need help quitting, ask your doctor.  Get at least 8 hours of sleep every night.  Limit and deal with stress. General instructions      Keep a journal to find out  what may bring on your migraine headaches. For example, write down: ? What you eat and drink. ? How much sleep you get. ? Any change in what you eat or drink. ? Any change in your medicines.  If you have a migraine headache: ? Avoid things that make your symptoms worse, such as bright lights. ? It may help to lie down in a dark, quiet  room. ? Do not drive or use heavy machinery. ? Ask your doctor what activities are safe for you.  Keep all follow-up visits as told by your doctor. This is important. Contact a doctor if:  You get a migraine headache that is different or worse than others you have had.  You have more than 15 headache days in one month. Get help right away if:  Your migraine headache gets very bad.  Your migraine headache lasts longer than 72 hours.  You have a fever.  You have a stiff neck.  You have trouble seeing.  Your muscles feel weak or like you cannot control them.  You start to lose your balance a lot.  You start to have trouble walking.  You pass out (faint).  You have a seizure. Summary  A migraine headache is a very strong throbbing pain on one side or both sides of your head. These headaches can also cause other symptoms.  This condition may be treated with medicines and changes to your lifestyle.  Keep a journal to find out what may bring on your migraine headaches.  Contact a doctor if you get a migraine headache that is different or worse than others you have had.  Contact your doctor if you have more than 15 headache days in a month. This information is not intended to replace advice given to you by your health care provider. Make sure you discuss any questions you have with your health care provider. Document Revised: 03/14/2019 Document Reviewed: 01/02/2019 Elsevier Patient Education  2020 [employer].

## 2020-03-01 NOTE — Progress Notes (Addendum)
PATIENT: Bridget Hicks DOB: December 03, 1971  REASON FOR VISIT: follow up HISTORY FROM: patient  Chief Complaint  Patient presents with  . Follow-up    RM1, alone, migraine     HISTORY OF PRESENT ILLNESS: Today 03/01/20 Bridget Hicks is a 49 y.o. female here today for follow up for migraines. She is doing very well. She has 4-6 headache days with 2-4 being migrainous each month. She uses 2-4 naratriptan for abortive therapy. She feels that naratriptan works well. She denies adverse effects. She recently started sertraline for depression/anxiety and feels it is helping as well.    HISTORY: (copied from Dr Cathren Laine note on 11/18/2018)  Interval history: Still the same. Usually around her periods. Friday and Saturday of the cycle. Naratriptan Discussed pre-treatment. She has side effects to her naratriptan. She can also pre-treat with alleve but affects stomach. She tried not having a period but she didn't like not having menstruation for 3 months. She just has 2 days of migraines a month. Naratriptan has been the best. She tried a lot of triptans.   HPI:  Bridget Hicks is a 49 y.o. female here as a referral from Dr. Ronita Hipps for migraines. Naratriptan helps, she may have to take 2 but that is rare. Migraines are right-sided, throbbing, photophobia, mild nausea but more weakness and feeling sick, needs a dark room, moving makes it worse. She tried a lot of other triptans. Migraines can be severe. Migraines happen during her period. Discussed management of menstrual migraines and birth control. She usually has one severe headache migraine day a month, 2 mild at most 6 migraine days a month. No other focal neurologic deficits, associated symptoms, inciting events or modifiable factors. She is happy with current management.   Reviewed notes, labs and imaging from outside physicians, which showed:  Reviewed referring physician's notes.  Stable menstrual pattern,  she is using naratriptan for acute treatment.  She is on birth control pills.  She is a prior patient of Dr. Vernell Barrier.  Headaches have been stable in the past.  At last appointment she reported one severe headache in 1-2 moderate headache days in the previous 30 days.  Headaches occur mainly during menstruation.  Tend to be located over the right side, when intense it throbs with associated nausea as well as light and sound sensitivity.  Amerge was working well for her and generally one dose was effective.  In the past she tried Relpax without benefit.  Maxalt initially of some benefit but eventually lost its effectiveness.  She is a Print production planner.  Exam was normal.  She was continued on only acute medication. Used Lodine as well.    REVIEW OF SYSTEMS: Out of a complete 14 system review of symptoms, the patient complains only of the following symptoms, headaches and all other reviewed systems are negative.  ALLERGIES: No Known Allergies  HOME MEDICATIONS: Outpatient Medications Prior to Visit  Medication Sig Dispense Refill  . desogestrel-ethinyl estradiol (MIRCETTE) 0.15-0.02/0.01 MG (21/5) tablet Take 1 tablet by mouth daily.    . Multiple Vitamin (MULTIVITAMIN) tablet Take 1 tablet by mouth daily.    . sertraline (ZOLOFT) 50 MG tablet Take 75 mg by mouth daily.    . naratriptan (AMERGE) 2.5 MG tablet TAKE 1 TABLET BY MOUTH AS NEEDED FOR MIGRAINE AS DIRECTED. Please have primary care provide further refills or if needed, please call [phone] and schedule appt with NP. 10 tablet 0   No facility-administered medications prior to visit.  PAST MEDICAL HISTORY: Past Medical History:  Diagnosis Date  . Anemia   . Migraine, menstrual     PAST SURGICAL HISTORY: Past Surgical History:  Procedure Laterality Date  . CESAREAN SECTION     x 2   . LAPAROSCOPY  2000    FAMILY HISTORY: Family History  Problem Relation Age of Onset  . Hypertension Father   . Heart attack Maternal  Grandfather   . Heart attack Paternal Grandfather     SOCIAL HISTORY: Social History   Socioeconomic History  . Marital status: Married    Spouse name: Not on file  . Number of children: 2  . Years of education: Not on file  . Highest education level: Bachelor's degree (e.g., BA, AB, BS)  Occupational History  . Occupation: Pharmacist, hospital    Comment: [employer]  Tobacco Use  . Smoking status: Never Smoker  . Smokeless tobacco: Never Used  Substance and Sexual Activity  . Alcohol use: No  . Drug use: No  . Sexual activity: Not on file  Other Topics Concern  . Not on file  Social History Narrative   Lives at home with her family   Right handed   1 cup caffeine a day if that    Social Determinants of Health   Financial Resource Strain:   . Difficulty of Paying Living Expenses:   Food Insecurity:   . Worried About Charity fundraiser in the Last Year:   . Arboriculturist in the Last Year:   Transportation Needs:   . Film/video editor (Medical):   Marland Kitchen Lack of Transportation (Non-Medical):   Physical Activity:   . Days of Exercise per Week:   . Minutes of Exercise per Session:   Stress:   . Feeling of Stress :   Social Connections:   . Frequency of Communication with Friends and Family:   . Frequency of Social Gatherings with Friends and Family:   . Attends Religious Services:   . Active Member of Clubs or Organizations:   . Attends Archivist Meetings:   Marland Kitchen Marital Status:   Intimate Partner Violence:   . Fear of Current or Ex-Partner:   . Emotionally Abused:   Marland Kitchen Physically Abused:   . Sexually Abused:       PHYSICAL EXAM  Vitals:   03/01/20 1454  BP: 134/84  Pulse: 73  Temp: 98.1 F (36.7 C)  Weight: 180 lb (81.6 kg)  Height: 5' 8"  (1.727 m)   Body mass index is 27.37 kg/m.  Generalized: Well developed, in no acute distress  Cardiology: normal rate and rhythm, no murmur noted Respiratory: clear to auscultation bilaterally    Neurological examination  Mentation: Alert oriented to time, place, history taking. Follows all commands speech and language fluent Cranial nerve II-XII: Pupils were equal round reactive to light. Extraocular movements were full, visual field were full on confrontational test. Facial sensation and strength were normal. Head turning and shoulder shrug  were normal and symmetric. Motor: The motor testing reveals 5 over 5 strength of all 4 extremities. Good symmetric motor tone is noted throughout.  Sensory: Sensory testing is intact to soft touch on all 4 extremities. No evidence of extinction is noted.  Coordination: Cerebellar testing reveals good finger-nose-finger and heel-to-shin bilaterally.  Gait and station: Gait is normal.   DIAGNOSTIC DATA (LABS, IMAGING, TESTING) - I reviewed patient records, labs, notes, testing and imaging myself where available.  No flowsheet data found.  Lab Results  Component Value Date   WBC 8.9 04/10/2011   HGB 13.6 04/10/2011   HCT 39.4 04/10/2011   MCV 87.9 04/10/2011   PLT 362 04/10/2011      Component Value Date/Time   NA 139 04/10/2011 0939   K 4.1 04/10/2011 0939   CL 105 04/10/2011 0939   CO2 26 04/10/2011 0939   GLUCOSE 89 04/10/2011 0939   BUN 9 04/10/2011 0939   CREATININE 0.67 04/10/2011 0939   CALCIUM 9.7 04/10/2011 0939   GFRNONAA >60 04/10/2011 0939   GFRAA  04/10/2011 0939    >60        The eGFR has been calculated using the MDRD equation. This calculation has not been validated in all clinical situations. eGFR's persistently <60 mL/min signify possible Chronic Kidney Disease.   No results found for: CHOL, HDL, LDLCALC, LDLDIRECT, TRIG, CHOLHDL No results found for: HGBA1C No results found for: VITAMINB12 No results found for: TSH     ASSESSMENT AND PLAN 49 y.o. year old female  has a past medical history of Anemia and Migraine, menstrual. here with     ICD-10-CM   1. Migraine without aura and without status  migrainosus, not intractable  G43.009     Schyler is doing very well. Migraines are well managed with as needed naratriptan. We will continue current treatment plan. Adequate hydration, well balanced diet and regular exercise encouraged. She will continue close follow up with PCP for anxiety/depression management. May continue refills through PCP if willing. Otherwise, follow up with [place] in 1 year. She verbalizes understanding and agreement with this plan.    No orders of the defined types were placed in this encounter.    Meds ordered this encounter  Medications  . naratriptan (AMERGE) 2.5 MG tablet    Sig: TAKE 1 TABLET BY MOUTH AS NEEDED FOR MIGRAINE AS DIRECTED.    Dispense:  10 tablet    Refill:  11    Order Specific Question:   Supervising Provider    Answer:   Melvenia Beam [id number]      I spent 15 minutes with the patient. 50% of this time was spent counseling and educating patient on plan of care and medications.    Debbora Presto, FNP-C 03/01/2020, 3:33 PM [facility] [address] [place], [place] [address] [phone]  Made any corrections needed, and agree with history, physical, neuro exam,assessment and plan as stated.     Sarina Ill, MD [facility]

## 2021-12-23 ENCOUNTER — Encounter (HOSPITAL_BASED_OUTPATIENT_CLINIC_OR_DEPARTMENT_OTHER): Payer: Self-pay

## 2021-12-23 ENCOUNTER — Other Ambulatory Visit: Payer: Self-pay

## 2021-12-23 ENCOUNTER — Emergency Department (HOSPITAL_BASED_OUTPATIENT_CLINIC_OR_DEPARTMENT_OTHER): Payer: BC Managed Care – PPO | Admitting: Radiology

## 2021-12-23 ENCOUNTER — Emergency Department (HOSPITAL_BASED_OUTPATIENT_CLINIC_OR_DEPARTMENT_OTHER)
Admission: EM | Admit: 2021-12-23 | Discharge: 2021-12-23 | Disposition: A | Discharge status: Home / Self Care | Payer: BC Managed Care – PPO | Attending: Emergency Medicine | Admitting: Emergency Medicine

## 2021-12-23 DIAGNOSIS — R0789 Other chest pain: Secondary | ICD-10-CM

## 2021-12-23 DIAGNOSIS — R079 Chest pain, unspecified: Secondary | ICD-10-CM | POA: Diagnosis present

## 2021-12-23 DIAGNOSIS — R071 Chest pain on breathing: Secondary | ICD-10-CM | POA: Insufficient documentation

## 2021-12-23 HISTORY — DX: Anxiety disorder, unspecified: F41.9

## 2021-12-23 LAB — CBC
HCT: 38.6 % (ref 36.0–46.0)
Hemoglobin: 12.6 g/dL (ref 12.0–15.0)
MCH: 29.1 pg (ref 26.0–34.0)
MCHC: 32.6 g/dL (ref 30.0–36.0)
MCV: 89.1 fL (ref 80.0–100.0)
Platelets: 361 10*3/uL (ref 150–400)
RBC: 4.33 MIL/uL (ref 3.87–5.11)
RDW: 12.9 % (ref 11.5–15.5)
WBC: 9.1 10*3/uL (ref 4.0–10.5)
nRBC: 0 % (ref 0.0–0.2)

## 2021-12-23 LAB — BASIC METABOLIC PANEL
Anion gap: 9 (ref 5–15)
BUN: 14 mg/dL (ref 6–20)
CO2: 25 mmol/L (ref 22–32)
Calcium: 9.2 mg/dL (ref 8.9–10.3)
Chloride: 106 mmol/L (ref 98–111)
Creatinine, Ser: 0.79 mg/dL (ref 0.44–1.00)
GFR, Estimated: >60 mL/min (ref >60)
Glucose, Bld: 111 mg/dL — ABNORMAL HIGH (ref 70–99)
Potassium: 4 mmol/L (ref 3.5–5.1)
Sodium: 140 mmol/L (ref 135–145)

## 2021-12-23 LAB — TROPONIN I (HIGH SENSITIVITY): Troponin I (High Sensitivity): 3 ng/L (ref <18)

## 2021-12-23 LAB — PREGNANCY, URINE: Preg Test, Ur: NEGATIVE

## 2021-12-23 MED ORDER — KETOROLAC TROMETHAMINE 30 MG/ML IJ SOLN
30.0000 mg | Freq: Once | INTRAMUSCULAR | Status: AC
  Administered 2021-12-23: 30 mg via INTRAMUSCULAR
  Filled 2021-12-23: qty 1

## 2021-12-23 MED ORDER — IBUPROFEN 800 MG PO TABS: 800.0000 mg | ORAL_TABLET | Freq: Three times a day (TID) | ORAL | 0 refills | Status: AC

## 2021-12-23 NOTE — Discharge Instructions (Addendum)
Your work-up today was reassuring.  As we discussed your blood work did not show any signs of anemia, infection, electrolyte injury, kidney injury.  Your EKG did not show any changes noted be concerning for heart attack.  Your chest x-ray was clear without any enlarged heart or pneumonia.  I suspect the pain you are experiencing is muscle related, please take 800 ibuprofen 3 times daily for the next week.  A few of the things we discussed to follow-up on 1-ask your primary about the oral birth control.  It has increased risk of blood clots now that you are greater than 50 you may not need to be on it if it is primarily intended for birth control rather than postmenopausal changes. 2-your blood pressure was slightly high in the ED.  This can be normal due to stress in this environment, have the blood pressure rechecked in the next week.  If the chest pain changes and how it feels, for example if it starts to feel heavy or like pressure, you start vomiting, feeling short of breath, having worsening pain with inspiration please return back to the ED for additional work-up at that time.  Follow-up with your primary care doctor next week for reevaluation.  See if she can see you on Monday or Tuesday preferably.

## 2021-12-23 NOTE — ED Provider Notes (Signed)
[facility] EMERGENCY DEPT Provider Note   CSN: [id number] Arrival date & time: 12/23/21  1521     History  Chief Complaint  Patient presents with   Chest Pain    Bridget Hicks is a 51 y.o. female.   Chest Pain  Patient presents with left-sided chest pain.  Been going on since around Christmas and constant, it became more severe 2 days ago.  Left-sided, feels sharp and does not radiate elsewhere.  There is no nausea or vomiting, is worsened by movement.  Denies any injuries to her chest wall.  Does not feel short of breath, no syncope. Not worse with exertion.   The pain is improved by not moving, Tylenol also helps alleviate the pain.  Heating the area with warm pack also helps alleviate the pain.  No history of MI or PE.  She does not smoke cigarettes, no history of hypertension, hyperlipidemia, diabetes.  No family history of CAD before the age of 50.  Past Medical History:  Diagnosis Date   Anemia    Anxiety    Migraine, menstrual      Home Medications Prior to Admission medications   Medication Sig Start Date End Date Taking? Authorizing Provider  ibuprofen (ADVIL) 800 MG tablet Take 1 tablet (800 mg total) by mouth 3 (three) times daily. 12/23/21  Yes Sherrill Raring, PA-C  desogestrel-ethinyl estradiol (MIRCETTE) 0.15-0.02/0.01 MG (21/5) tablet Take 1 tablet by mouth daily.    [provider]  Multiple Vitamin (MULTIVITAMIN) tablet Take 1 tablet by mouth daily.    [provider]  naratriptan (AMERGE) 2.5 MG tablet TAKE 1 TABLET BY MOUTH AS NEEDED FOR MIGRAINE AS DIRECTED. 03/01/20   Lomax, Amy, NP  sertraline (ZOLOFT) 50 MG tablet Take 75 mg by mouth daily. 02/26/20   [provider]      Allergies    Patient has no known allergies.    Review of Systems   Review of Systems  Cardiovascular:  Positive for chest pain.   Physical Exam Updated Vital Signs BP (!) 168/94    Pulse (!) 58    Temp 98.5 F (36.9 C)  (Oral)    Resp 17    Ht 5\' 8"  (1.727 m)    Wt 86.2 kg    LMP 11/26/2021    SpO2 99%    BMI 28.89 kg/m  Physical Exam Vitals and nursing note reviewed. Exam conducted with a chaperone present.  Constitutional:      Appearance: Normal appearance.  HENT:     Head: Normocephalic and atraumatic.  Eyes:     General: No scleral icterus.       Right eye: No discharge.        Left eye: No discharge.     Extraocular Movements: Extraocular movements intact.     Pupils: Pupils are equal, round, and reactive to light.  Cardiovascular:     Rate and Rhythm: Normal rate and regular rhythm.     Pulses: Normal pulses.     Heart sounds: Normal heart sounds. No murmur heard.   No friction rub. No gallop.  Pulmonary:     Effort: Pulmonary effort is normal. No respiratory distress.     Breath sounds: Normal breath sounds.  Chest:     Chest wall: Tenderness present.  Abdominal:     General: Abdomen is flat. Bowel sounds are normal. There is no distension.     Palpations: Abdomen is soft.     Tenderness: There is no abdominal  tenderness.  Musculoskeletal:     Right lower leg: No edema.     Left lower leg: No edema.  Skin:    General: Skin is warm and dry.     Coloration: Skin is not jaundiced.  Neurological:     Mental Status: She is alert. Mental status is at baseline.     Coordination: Coordination normal.   ED Results / Procedures / Treatments   Labs (all labs ordered are listed, but only abnormal results are displayed) Labs Reviewed  BASIC METABOLIC PANEL - Abnormal; Notable for the following components:      Result Value   Glucose, Bld 111 (*)    All other components within normal limits  CBC  PREGNANCY, URINE  TROPONIN I (HIGH SENSITIVITY)    EKG None  Radiology DG Chest 2 View  Result Date: 12/23/2021 CLINICAL DATA:  Chest pain EXAM: CHEST - 2 VIEW COMPARISON:  04/10/2011 FINDINGS: The heart size and mediastinal contours are within normal limits. Both lungs are clear. The  visualized skeletal structures are unremarkable. IMPRESSION: No active cardiopulmonary disease. Electronically Signed   By: Donavan Foil M.D.   On: 12/23/2021 16:15    Procedures Procedures    Medications Ordered in ED Medications  ketorolac (TORADOL) 30 MG/ML injection 30 mg (30 mg Intramuscular Given 12/23/21 1839)    ED Course/ Medical Decision Making/ A&P    This is a 51 year old female presenting with chest pain.  Medical history is notable for menstrual migraines and she is currently on oral birth control.  History of anemia and anxiety as well.  No cardiac history, no history of hypertension.    She is hypertensive here in the ED, no tachypnea, hypoxia or tachycardia.  Physical exam relatively benign.  Radial pulse 2+ equal bilaterally, lungs are clear to auscultation bilaterally without any tachypnea.  No edema to the lower extremities, do not auscultate any murmurs.  Chest wall pain notably is reproducible with palpation and movement. PE was considered, however given the lack of tachycardia or hypoxia or pleuritic chest pain I think it is unlikely.   Patient heart score is a 1.   Additional history obtained: -Additional history obtained from patient spouse who is at bedside -External records from outside source obtained and reviewed including: Chart review including previous notes, labs, imaging, consultation notes   Lab Tests: -I ordered, reviewed, and interpreted labs.  The pertinent results include:   Negative initial troponin.  Given heart score 1, constant nature of the pain, reproducible nature that does not appear cardiac in nature I do not feel she needs a second troponin.  I doubt this is ACS, no ischemic findings on EKG.   CBC is without any leukocytosis or anemia.  BMP does not show any evidence of AKI, electrolyte derangement.  No evidence of renal impairment or concern for hypertensive emergency at this time.  EKG -Normal sinus rhythm, no ischemic  changes   Imaging Studies ordered: -I ordered imaging studies including chest x-ray -I independently visualized and interpreted imaging which showed stable cardiac silhouette.  No pneumonia, no widened mediastinum concerning for dissection, no pneumothorax. -I agree with the radiologist interpretation   Medicines ordered and prescription drug management: -I ordered medication including Toradol for chest pain -Reevaluation of the patient after these medicines showed that the patient improved -I have reviewed the patients home medicines and have made adjustments as needed   Cardiac Monitoring: The patient was maintained on a cardiac monitor.  I personally viewed and interpreted  the cardiac monitored which showed an underlying rhythm of: NSR   Reevaluation: After the interventions noted above, I reevaluated the patient and found that they have :improved   Dispostion: Do not feel that this is cardiac in nature.  I considered but think unlikely ACS, PE, dissection, pneumonia, PTX, esophageal rupture.  Suspect costochondritis more likely.  I did discuss with the patient the need to follow-up with her PCP regarding continued use of oral contraceptive.  Also advised her to monitor her blood pressure over the weekend and follow-up with her PCP on Monday for recheck given hypertensive here in the ED with no history.  Return precautions were discussed and agreed upon, she was discharged in stable condition.         Final Clinical Impression(s) / ED Diagnoses Final diagnoses:  Costochondral chest pain    Rx / DC Orders ED Discharge Orders          Ordered    ibuprofen (ADVIL) 800 MG tablet  3 times daily        12/23/21 1816              Sherrill Raring, PA-C 12/23/21 2143    Jeanell Sparrow, DO 12/24/21 1321

## 2021-12-23 NOTE — ED Triage Notes (Signed)
Pt c/o CP that started in December and has become worse over last few days. Pt locates it to the L side and is described as sharp, like "something is poking me." Pain is aggravated by movements such as lifting, pushing, or pulling. Palliated by heat. Denies ShOB.

## 2022-01-02 ENCOUNTER — Other Ambulatory Visit: Payer: Self-pay

## 2022-01-02 ENCOUNTER — Ambulatory Visit (HOSPITAL_COMMUNITY)
Admission: RE | Admit: 2022-01-02 | Discharge: 2022-01-02 | Disposition: A | Discharge status: Home / Self Care | Payer: BC Managed Care – PPO | Source: Ambulatory Visit | Attending: Gastroenterology | Admitting: Gastroenterology

## 2022-01-02 ENCOUNTER — Other Ambulatory Visit (HOSPITAL_COMMUNITY): Payer: Self-pay | Admitting: Gastroenterology

## 2022-01-02 ENCOUNTER — Other Ambulatory Visit: Payer: Self-pay | Admitting: Gastroenterology

## 2022-01-02 DIAGNOSIS — R933 Abnormal findings on diagnostic imaging of other parts of digestive tract: Secondary | ICD-10-CM

## 2022-01-02 MED ORDER — SODIUM CHLORIDE (PF) 0.9 % IJ SOLN
INTRAMUSCULAR | Status: AC
  Filled 2022-01-02: qty 50

## 2022-01-02 MED ORDER — BARIUM SULFATE 0.1 % PO SUSP
ORAL | Status: AC
  Filled 2022-01-02: qty 1

## 2022-01-02 MED ORDER — IOHEXOL 300 MG/ML  SOLN
100.0000 mL | Freq: Once | INTRAMUSCULAR | Status: AC | PRN
  Administered 2022-01-02: 100 mL via INTRAVENOUS

## 2022-01-06 ENCOUNTER — Other Ambulatory Visit (HOSPITAL_COMMUNITY): Payer: Self-pay | Admitting: Gastroenterology

## 2022-01-06 DIAGNOSIS — R935 Abnormal findings on diagnostic imaging of other abdominal regions, including retroperitoneum: Secondary | ICD-10-CM

## 2022-01-19 ENCOUNTER — Ambulatory Visit (HOSPITAL_COMMUNITY)
Admission: RE | Admit: 2022-01-19 | Discharge: 2022-01-19 | Disposition: A | Discharge status: Home / Self Care | Payer: BC Managed Care – PPO | Source: Ambulatory Visit | Attending: Gastroenterology | Admitting: Gastroenterology

## 2022-01-19 ENCOUNTER — Other Ambulatory Visit: Payer: Self-pay

## 2022-01-19 DIAGNOSIS — R935 Abnormal findings on diagnostic imaging of other abdominal regions, including retroperitoneum: Secondary | ICD-10-CM | POA: Diagnosis not present

## 2022-01-19 MED ORDER — COPPER CU 64 DOTATATE 1 MCI/ML IV SOLN
4.0000 | Freq: Once | INTRAVENOUS | Status: AC
  Administered 2022-01-19: 4.3 via INTRAVENOUS

## 2022-09-16 IMAGING — PT NM PET SKULL BASE TO THIGH
7 series · 25 of 25 positions shown · non-contrast
Comparison: CT 01/02/2022

CLINICAL DATA: Abnormal colonoscopy. Mass lesion at the cecum.
Indeterminate CT examination. Concern for neuroendocrine tumor.

EXAM:
NUCLEAR MEDICINE PET SKULL BASE TO THIGH
TECHNIQUE: 4.3 mCi Asdasdasdsa 64 DOTATATE was injected intravenously. Full-ring PET
imaging was performed from the skull base to thigh after the
radiotracer. CT data was obtained and used for attenuation
correction and anatomic localization.

[Series 3: pet sk_thigh ac · axial · 5.0mm · 4.07mm/px · z∈[-1345,-429]mm · 6 of 230 slices shown]
[im 1/230]
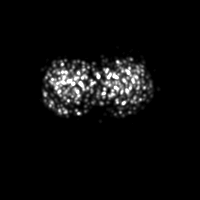
[im 46/230]
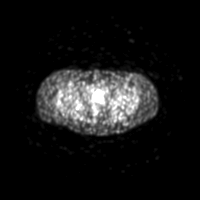
[im 92/230]
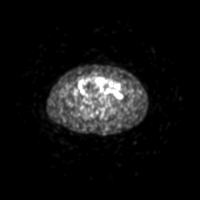
[im 138/230]
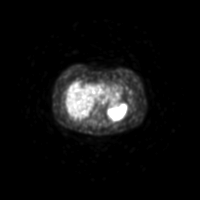
[im 184/230]
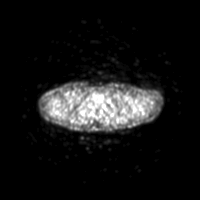
[im 230/230]
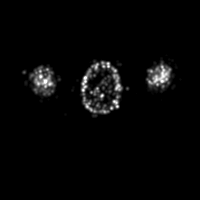

[Series 4: ct sk_thigh 5.0 bf37 · axial · 5.0mm · 0.98mm/px · z∈[-1345,-429]mm · 5 of 230 slices shown]
[im 1/230]
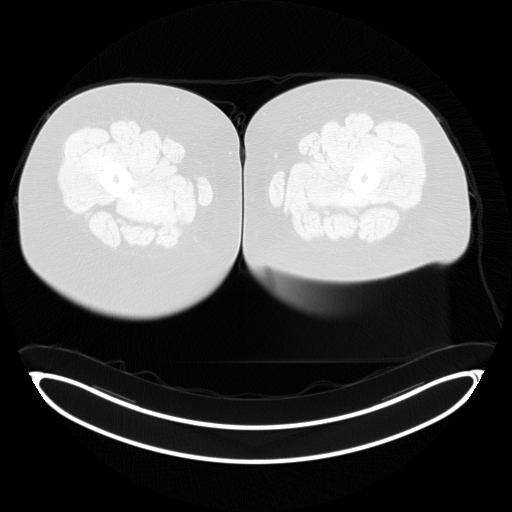
[im 58/230]
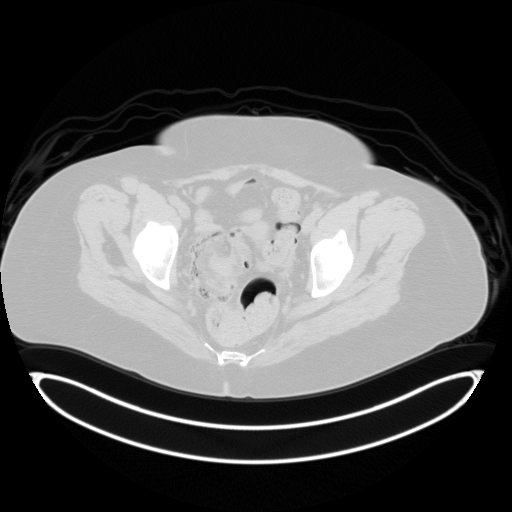
[im 115/230]
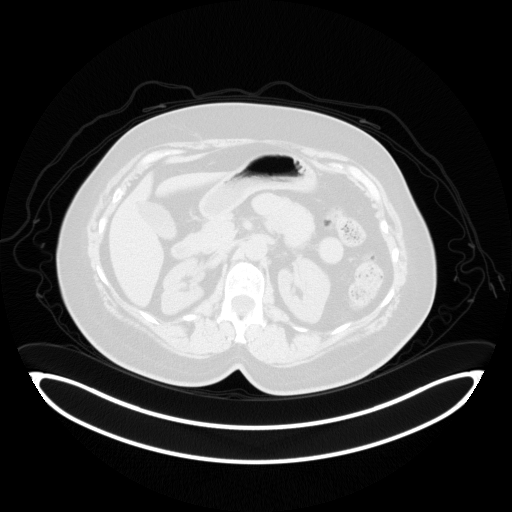
[im 172/230]
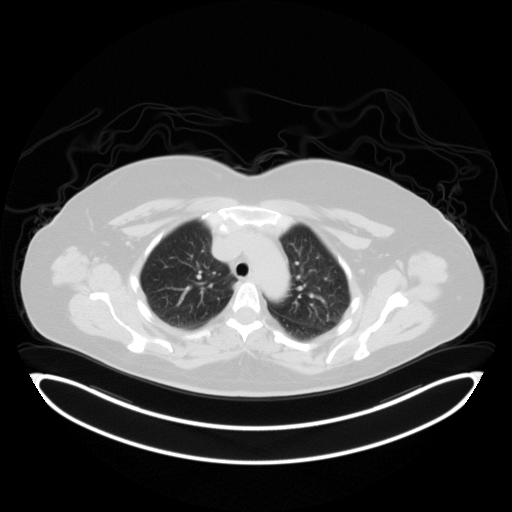
[im 230/230  brain]
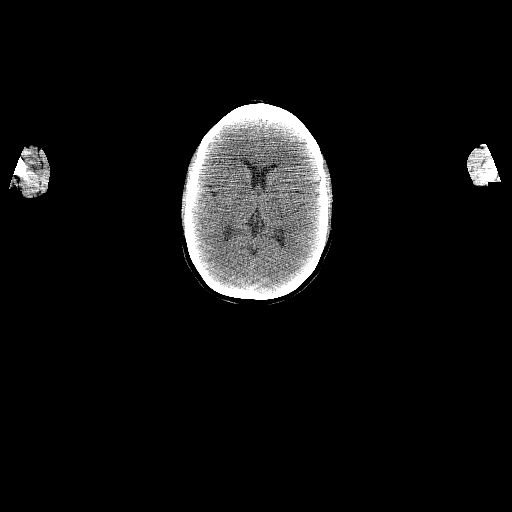

[Series 5: pet sk_thigh nac · axial · 5.0mm · 4.07mm/px · z∈[-1345,-429]mm · 5 of 230 slices shown]
[im 1/230]
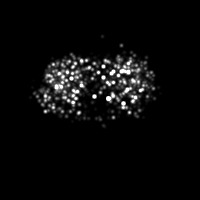
[im 58/230]
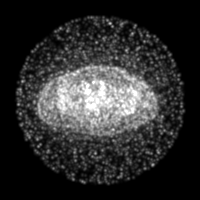
[im 115/230]
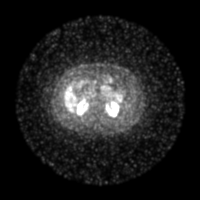
[im 172/230]
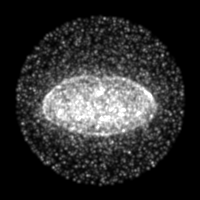
[im 230/230]
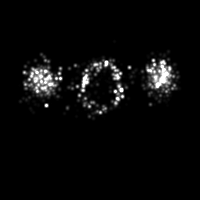

[Series 8: ct sk_thigh 5.0 br59 lung_bone · axial · 5.0mm · 0.61mm/px · z∈[-846,-574]mm · 2 of 69 slices shown]
[im 1/69]
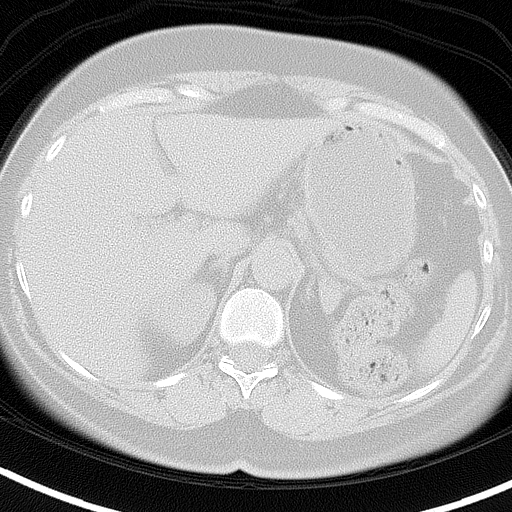
[im 69/69]
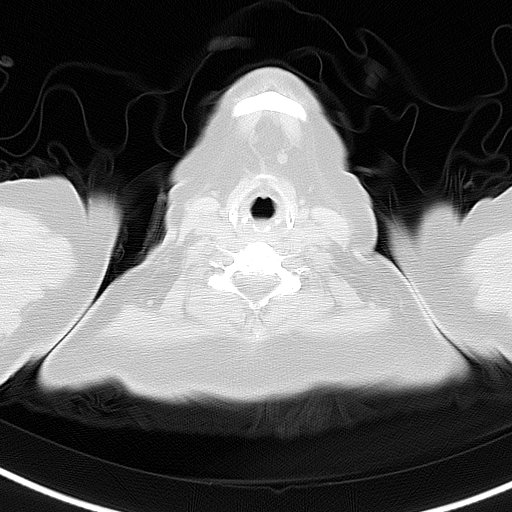

[Series 603: fused cor · 1 of 59 slices shown]
[im 1/59]
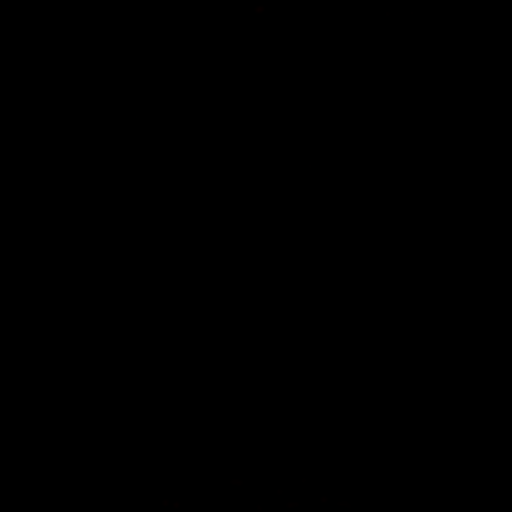

[Series 604: <mip collection> · coronal · 1.90mm/px · 1 of 32 slices shown]
[im 1/32]
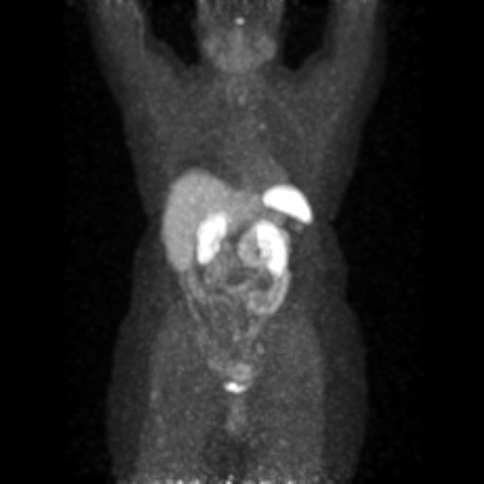

[Series 605: range-ct sk_thigh 5.0 bf37-tra-<alpha range> · 5 of 225 slices shown]
[im 1/225]
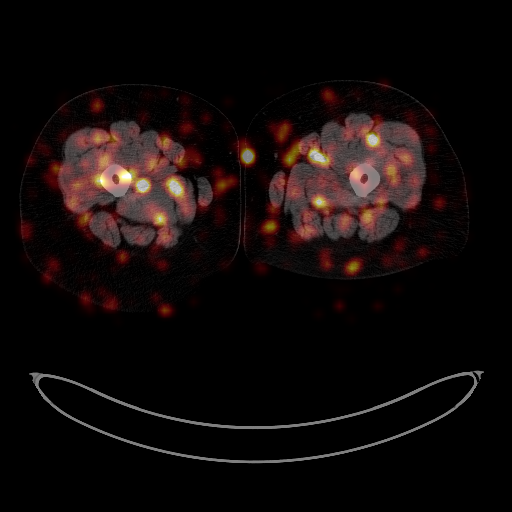
[im 57/225]
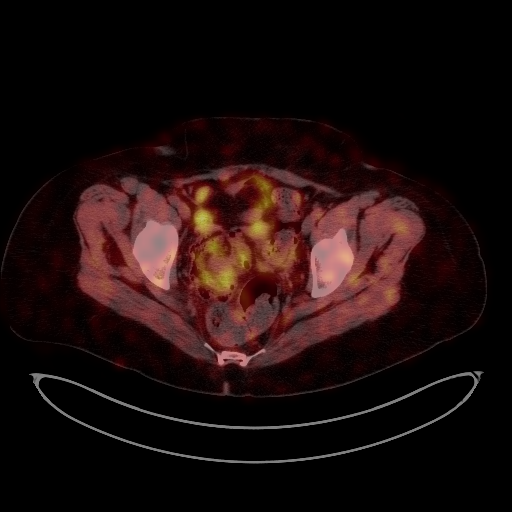
[im 113/225]
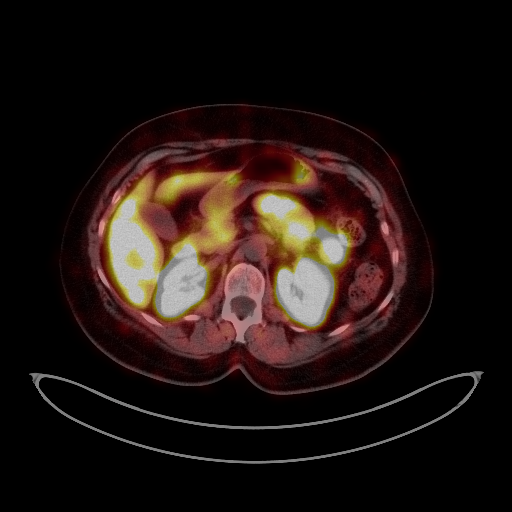
[im 169/225]
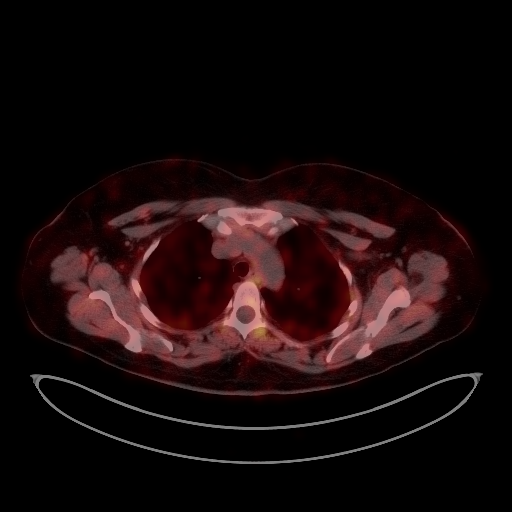
[im 225/225]
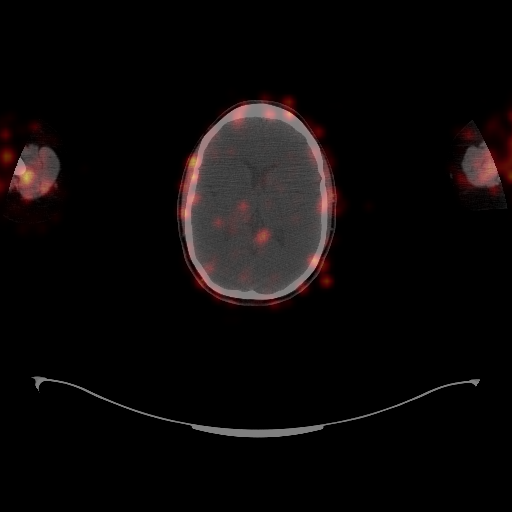

[25 of 25 positions shown; findings below may reference images not displayed]

FINDINGS: NECK

No radiotracer activity in neck lymph nodes.

Incidental CT findings: None

CHEST

No radiotracer accumulation within mediastinal or hilar lymph nodes.
No suspicious pulmonary nodules on the CT scan.

Incidental CT finding:None

ABDOMEN/PELVIS

The cecum is moved position from comparison CT and is now within the
deep RIGHT pelvis adjacent the uterus. Again demonstrated fatty
tissue associated with the ileocecal bowel. There is no abnormal
radiotracer accumulation within the cecum or terminal ileum. No
abnormal radiotracer activity in the tissue surrounding the cecum.

Physiologic activity within the adjacent uterus is noted.

Elsewhere no evidence of neuroendocrine tumor in the small bowel or
colon. No mesenteric nodularity. No liver metastasis.

Physiologic activity noted in the liver, spleen, adrenal glands and
kidneys.

Incidental CT findings:Benign hepatic cysts

SKELETON

No focal activity to suggest skeletal metastasis.

Incidental CT findings:None
IMPRESSION: 1. No evidence of well differentiated neuroendocrine tumor in the
cecum, terminal ileum or pericecal tissues.
2. No evidence of metastatic well differentiated neuroendocrine
tumor.

## 2024-02-15 ENCOUNTER — Other Ambulatory Visit: Payer: Self-pay | Admitting: Nurse Practitioner

## 2024-02-15 ENCOUNTER — Encounter: Payer: Self-pay | Admitting: Nurse Practitioner

## 2024-02-15 DIAGNOSIS — R1909 Other intra-abdominal and pelvic swelling, mass and lump: Secondary | ICD-10-CM

## 2024-02-15 DIAGNOSIS — R1031 Right lower quadrant pain: Secondary | ICD-10-CM

## 2024-02-20 ENCOUNTER — Ambulatory Visit
Admission: RE | Admit: 2024-02-20 | Discharge: 2024-02-20 | Disposition: A | Discharge status: Home / Self Care | Payer: Self-pay | Source: Ambulatory Visit | Attending: Nurse Practitioner | Admitting: Nurse Practitioner

## 2024-02-20 DIAGNOSIS — R1031 Right lower quadrant pain: Secondary | ICD-10-CM

## 2024-02-20 DIAGNOSIS — R1909 Other intra-abdominal and pelvic swelling, mass and lump: Secondary | ICD-10-CM
